# Patient Record
Sex: Male | Born: 1950 | Race: White | Hispanic: No | Marital: Married
Health system: Southern US, Community
[De-identification: ages and names within clinical notes are randomized; demographics above are authoritative.]

---

## 2001-03-12 ENCOUNTER — Other Ambulatory Visit: Admission: RE | Admit: 2001-03-12 | Discharge: 2001-03-12 | Payer: Self-pay | Admitting: Urology

## 2001-03-12 ENCOUNTER — Encounter (INDEPENDENT_AMBULATORY_CARE_PROVIDER_SITE_OTHER): Payer: Self-pay | Admitting: Specialist

## 2004-05-08 ENCOUNTER — Ambulatory Visit (HOSPITAL_COMMUNITY): Admission: RE | Admit: 2004-05-08 | Discharge: 2004-05-08 | Payer: Self-pay | Admitting: Gastroenterology

## 2007-03-27 ENCOUNTER — Encounter: Admission: RE | Admit: 2007-03-27 | Discharge: 2007-06-10 | Payer: Self-pay | Admitting: Neurosurgery

## 2009-04-02 ENCOUNTER — Emergency Department (HOSPITAL_BASED_OUTPATIENT_CLINIC_OR_DEPARTMENT_OTHER): Admission: EM | Admit: 2009-04-02 | Discharge: 2009-04-02 | Payer: Self-pay | Admitting: Emergency Medicine

## 2009-04-02 ENCOUNTER — Ambulatory Visit: Payer: Self-pay | Admitting: Radiology

## 2010-10-04 IMAGING — CR DG WRIST COMPLETE 3+V*R*
4 series · 4 of 4 positions shown · non-contrast
Comparison: None

CLINICAL DATA: Fell off of a ladder;   bilateral wrist pain

RIGHT WRIST - COMPLETE 3+ VIEW

[x wrist pa right]
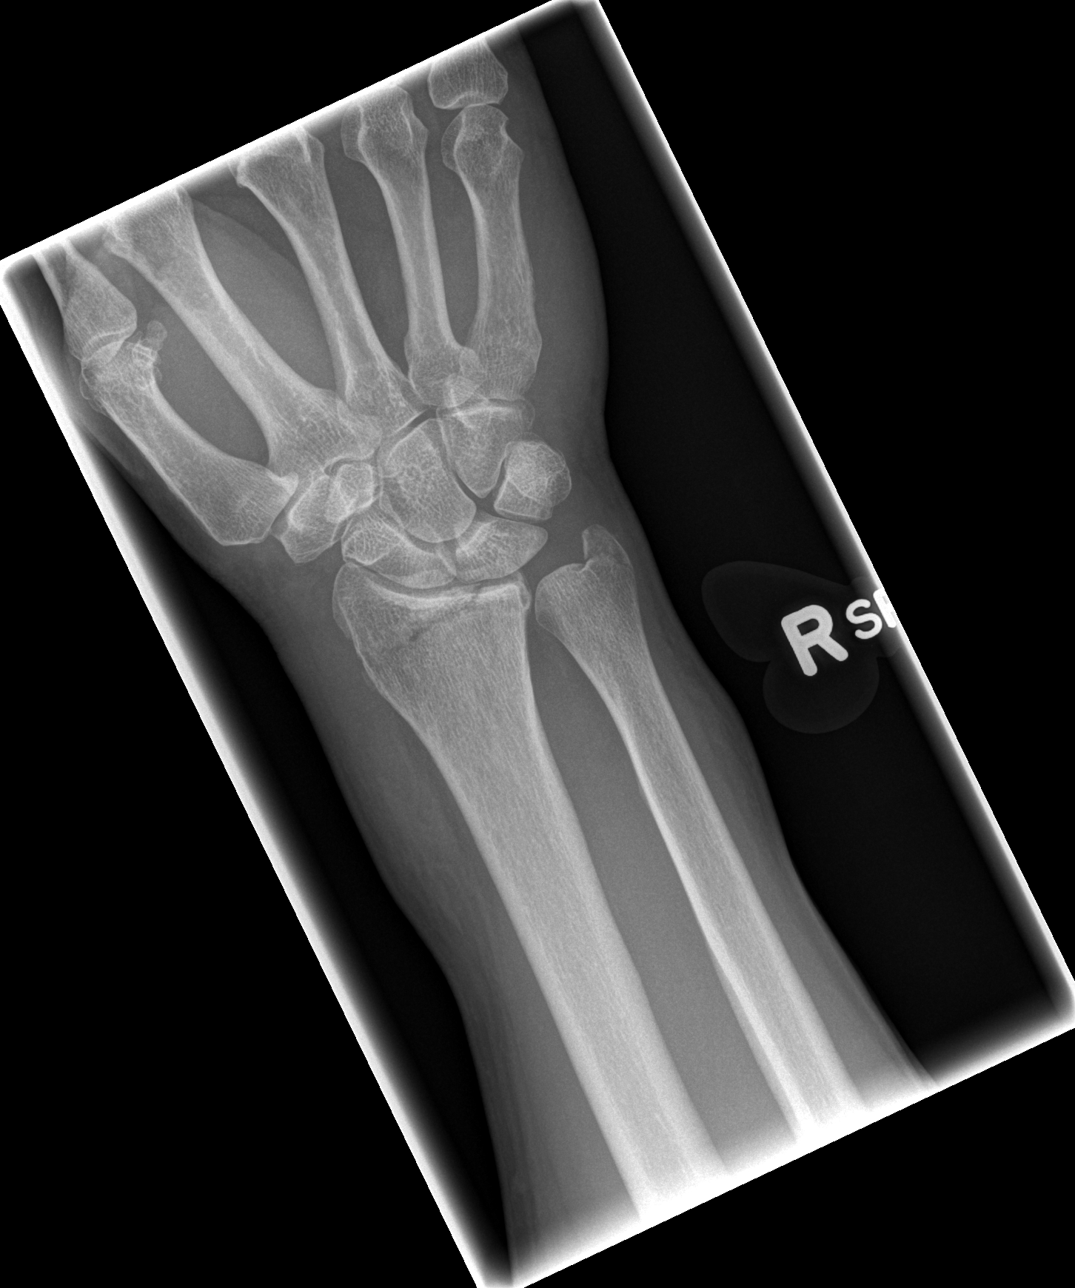

[x wrist obl right]
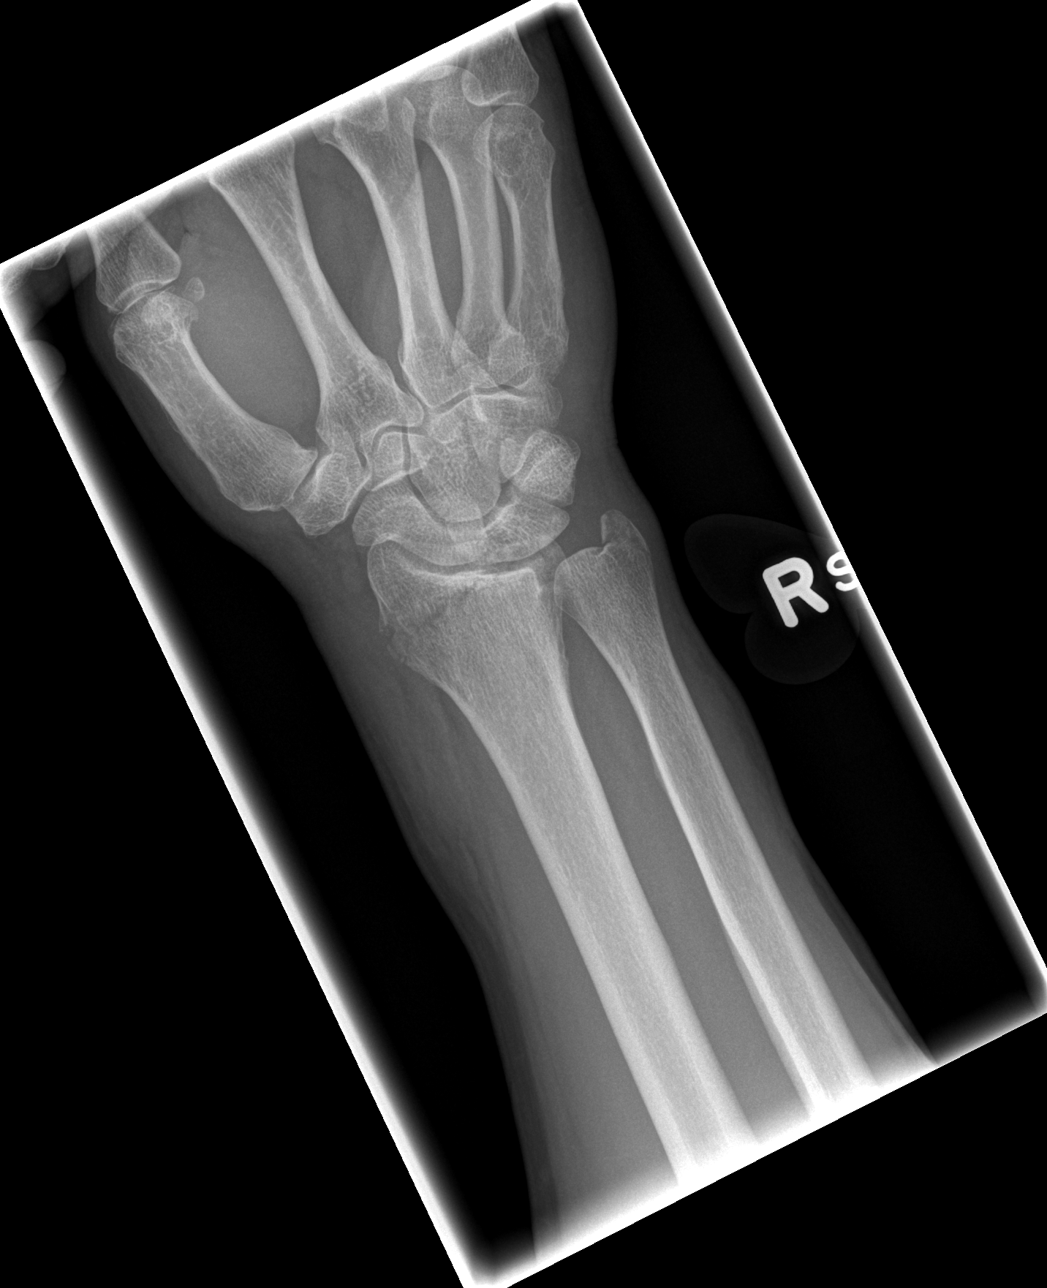

[x wrist lat right]
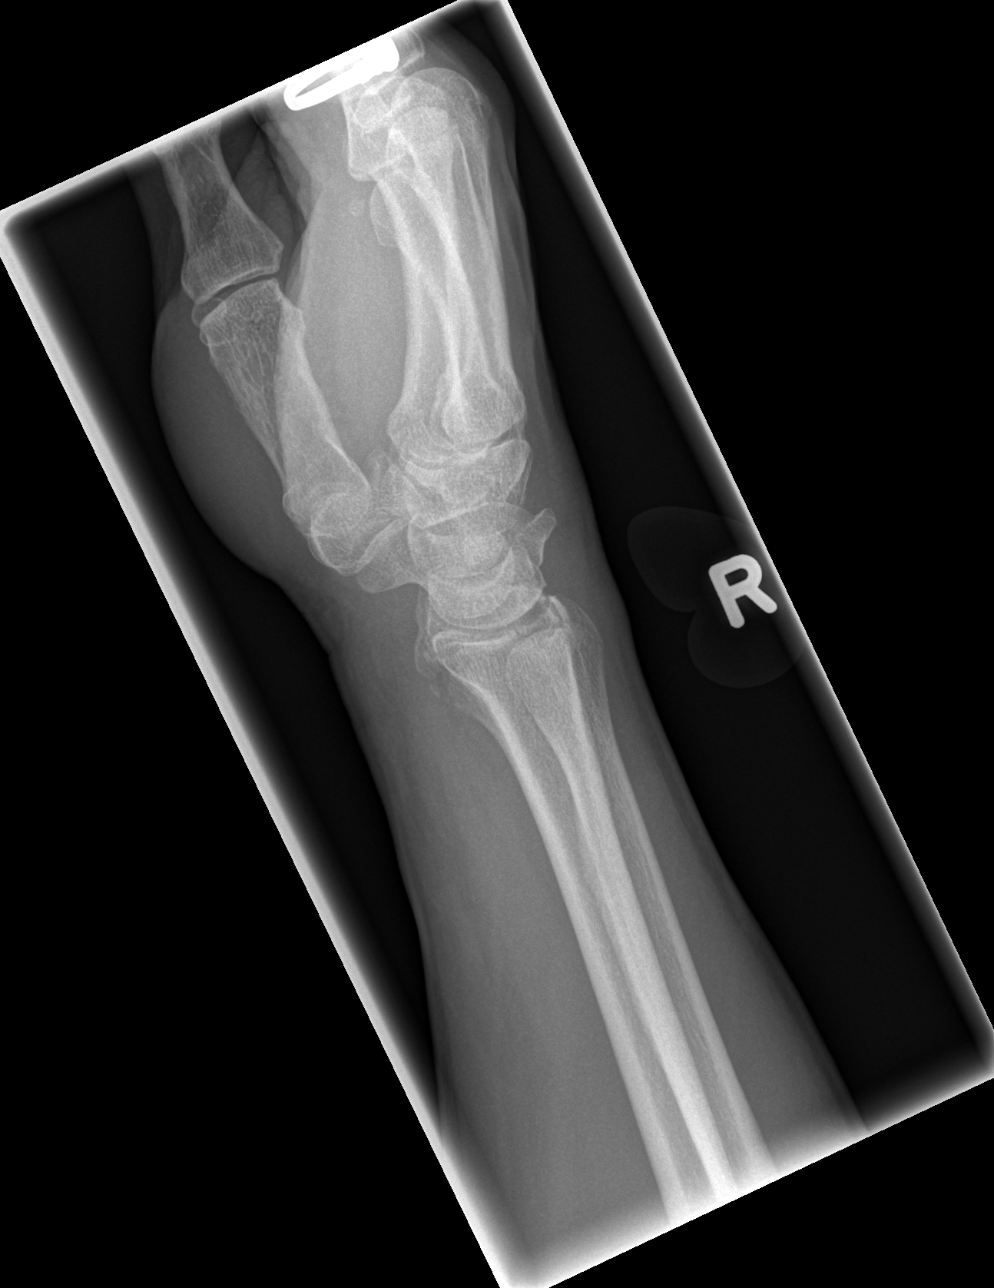

[x navicular]
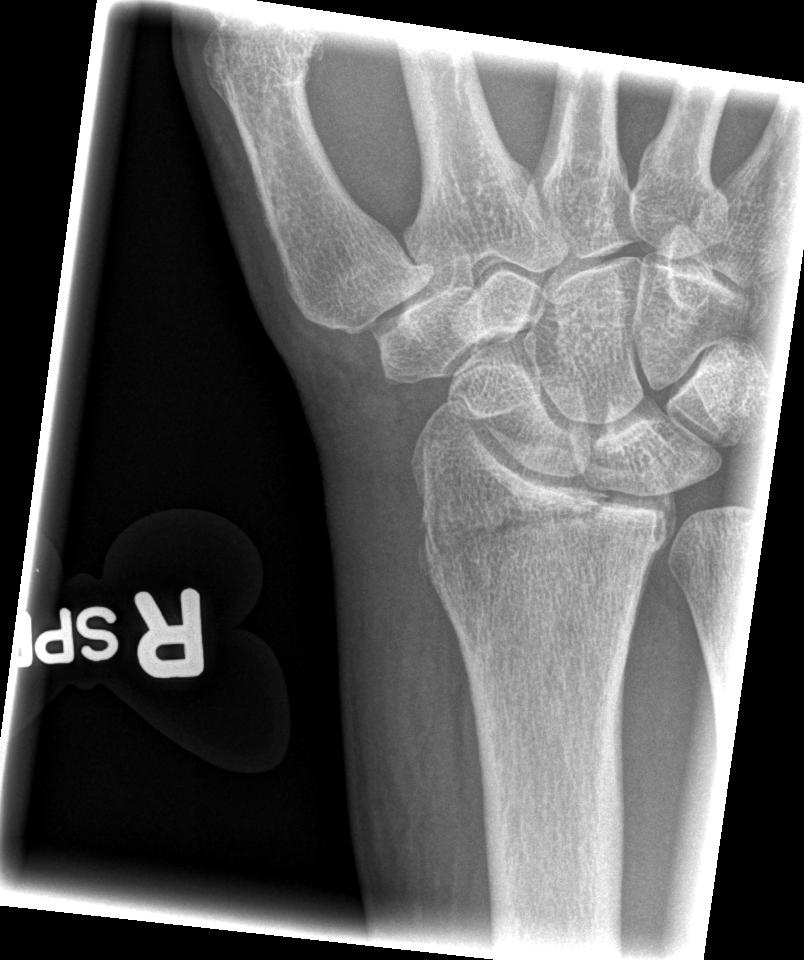

[4 of 4 positions shown; findings below may reference images not displayed]

FINDINGS: There is a nondisplaced fracture of the distal radial
metaphysis with extension to the articular surface.  There is also
a nondisplaced fracture of the base of the ulnar styloid process.
IMPRESSION: Fractures of distal right radial metaphysis and ulnar styloid
process.

## 2010-10-04 IMAGING — CR DG FOREARM 2V*R*
2 series · 2 of 2 positions shown · non-contrast
Comparison: Views of right wrist.

CLINICAL DATA: The patient fell

RIGHT FOREARM - 2 VIEW

[x forearm lat right]
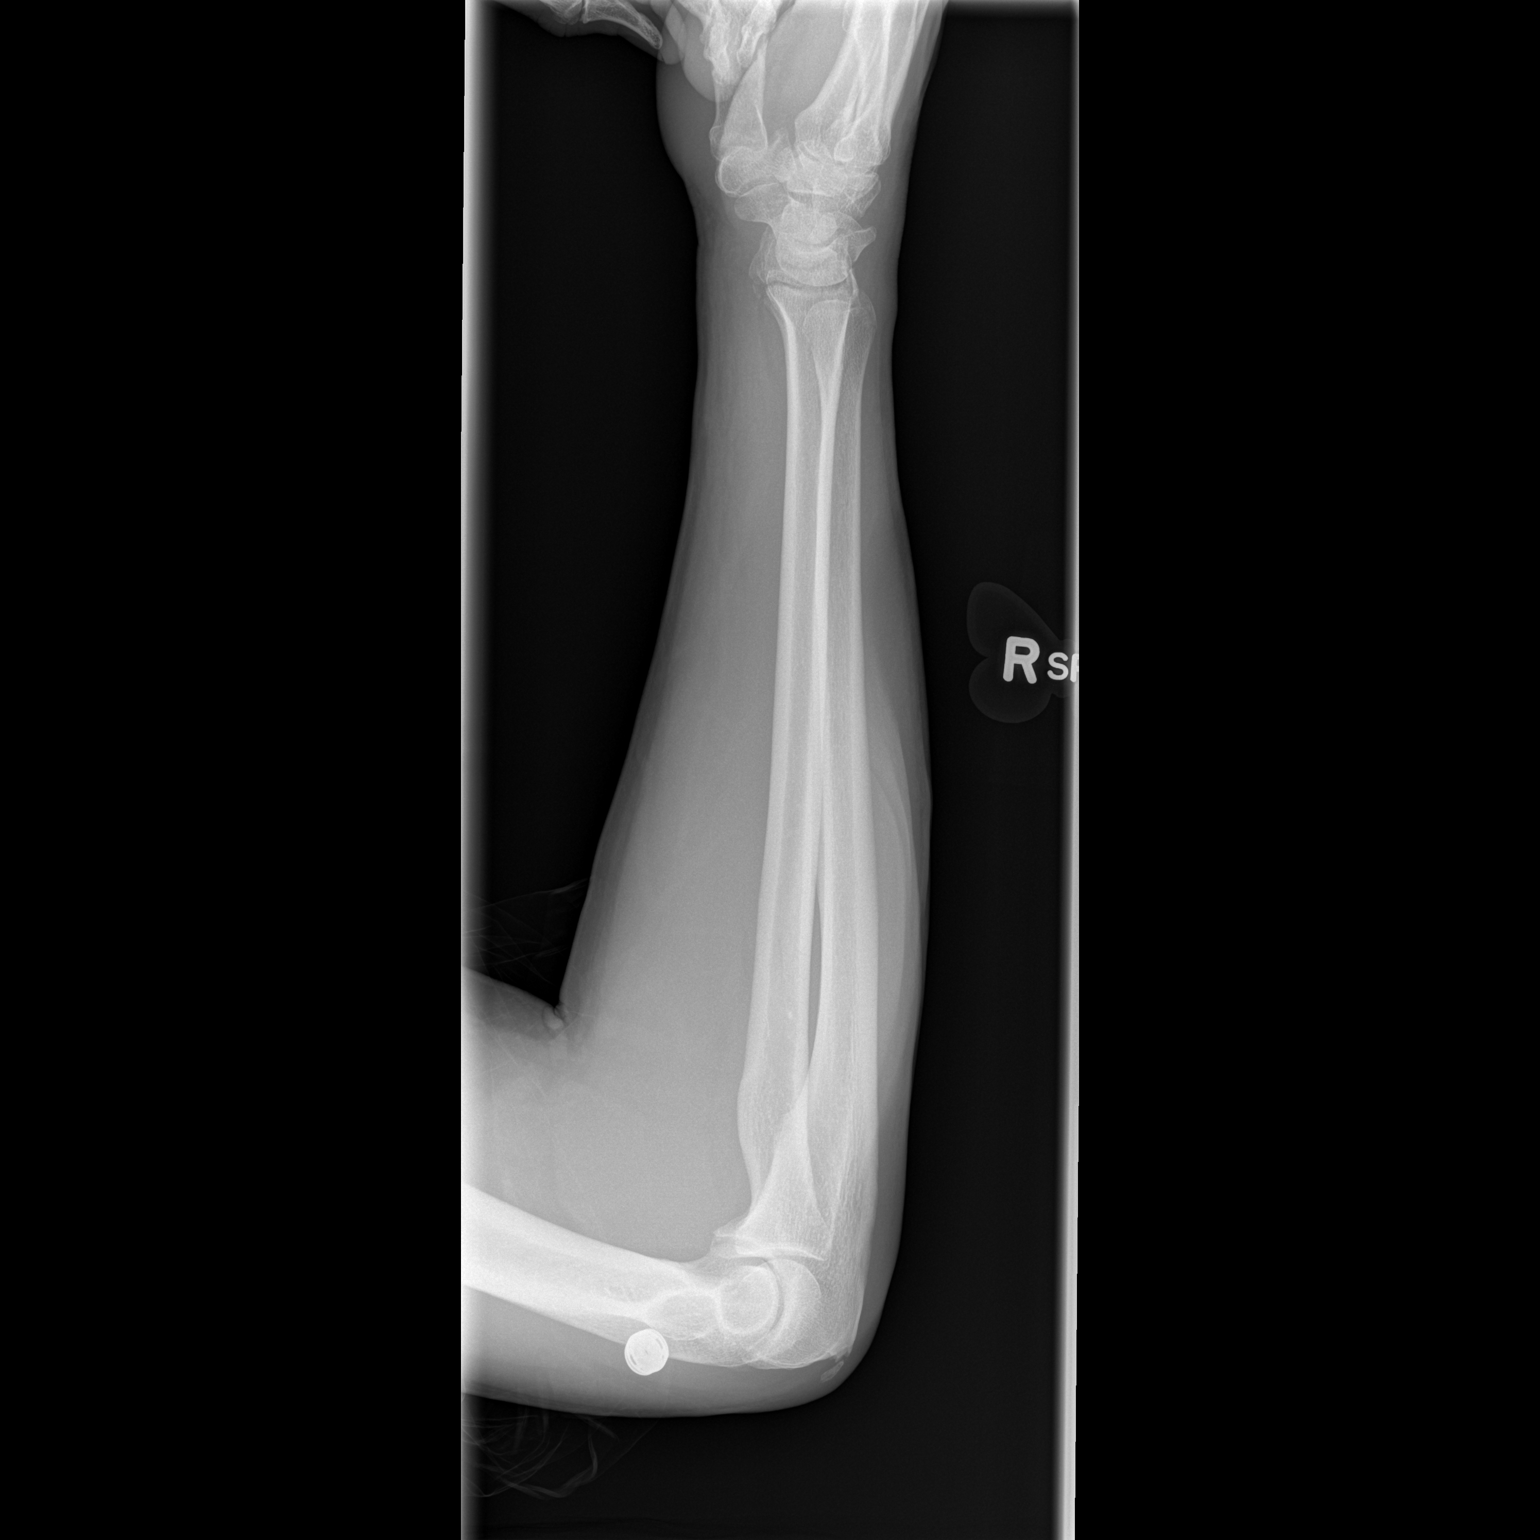

[x forearm ap right]
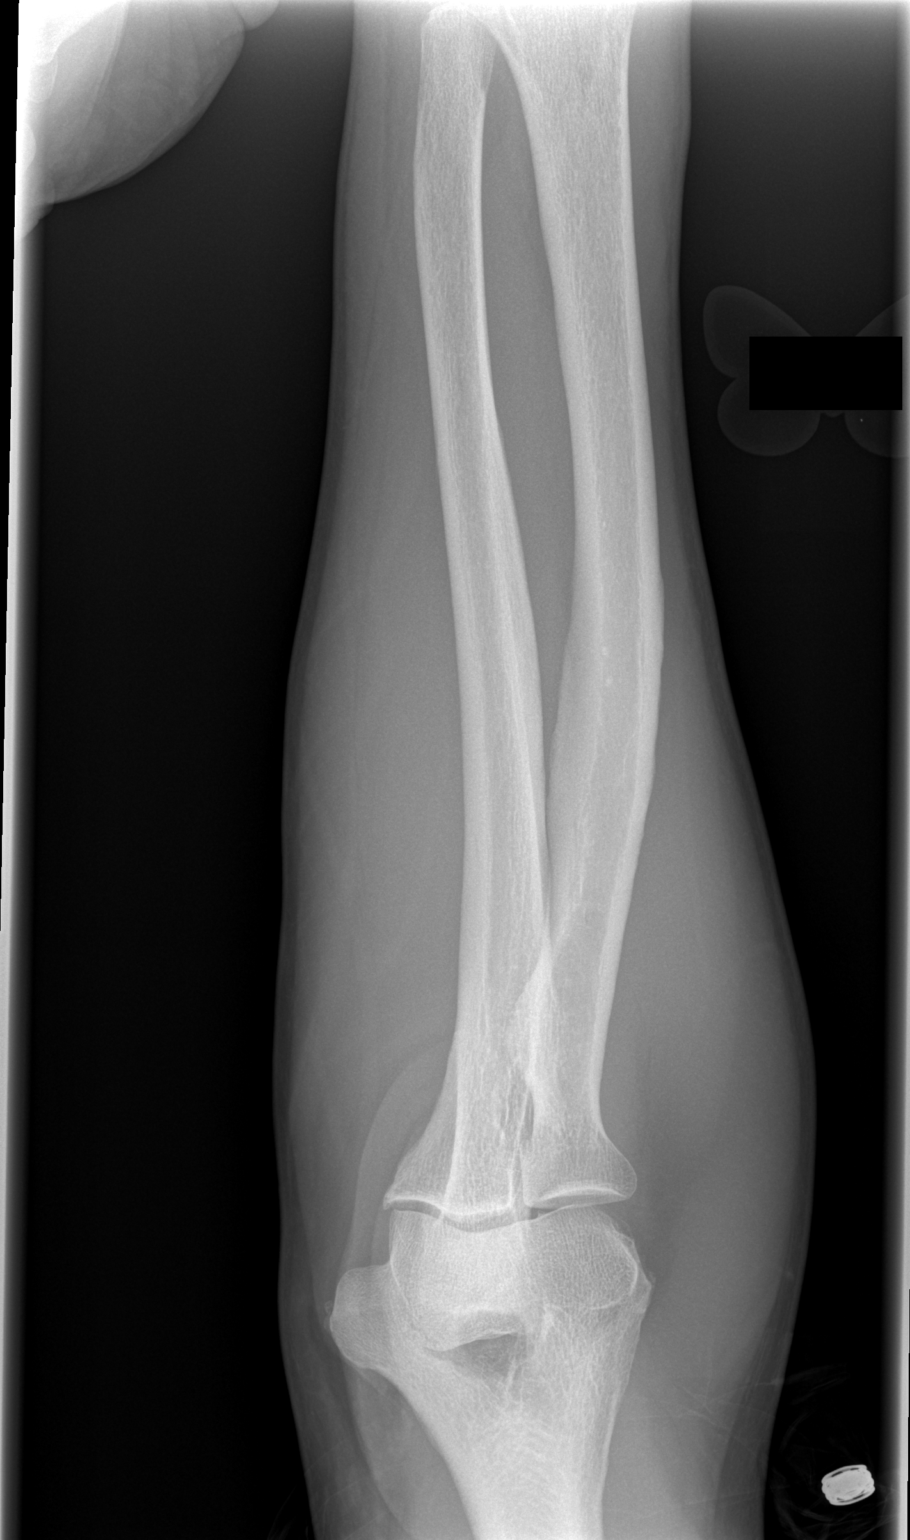

[2 of 2 positions shown; findings below may reference images not displayed]

FINDINGS: Fractures of the distal radial metaphysis and ulnar
styloid process are again noted.  The forearm is intact.
IMPRESSION: Fractures of distal radial metaphysis and ulnar styloid process.

## 2010-10-04 IMAGING — CR DG PELVIS 1-2V
1 series · 1 of 1 positions shown · non-contrast
Comparison: None

CLINICAL DATA: Fell off of a ladder.  Right lateral hip pain.

PELVIS - 1-2 VIEW

[t pelvis a.p.]
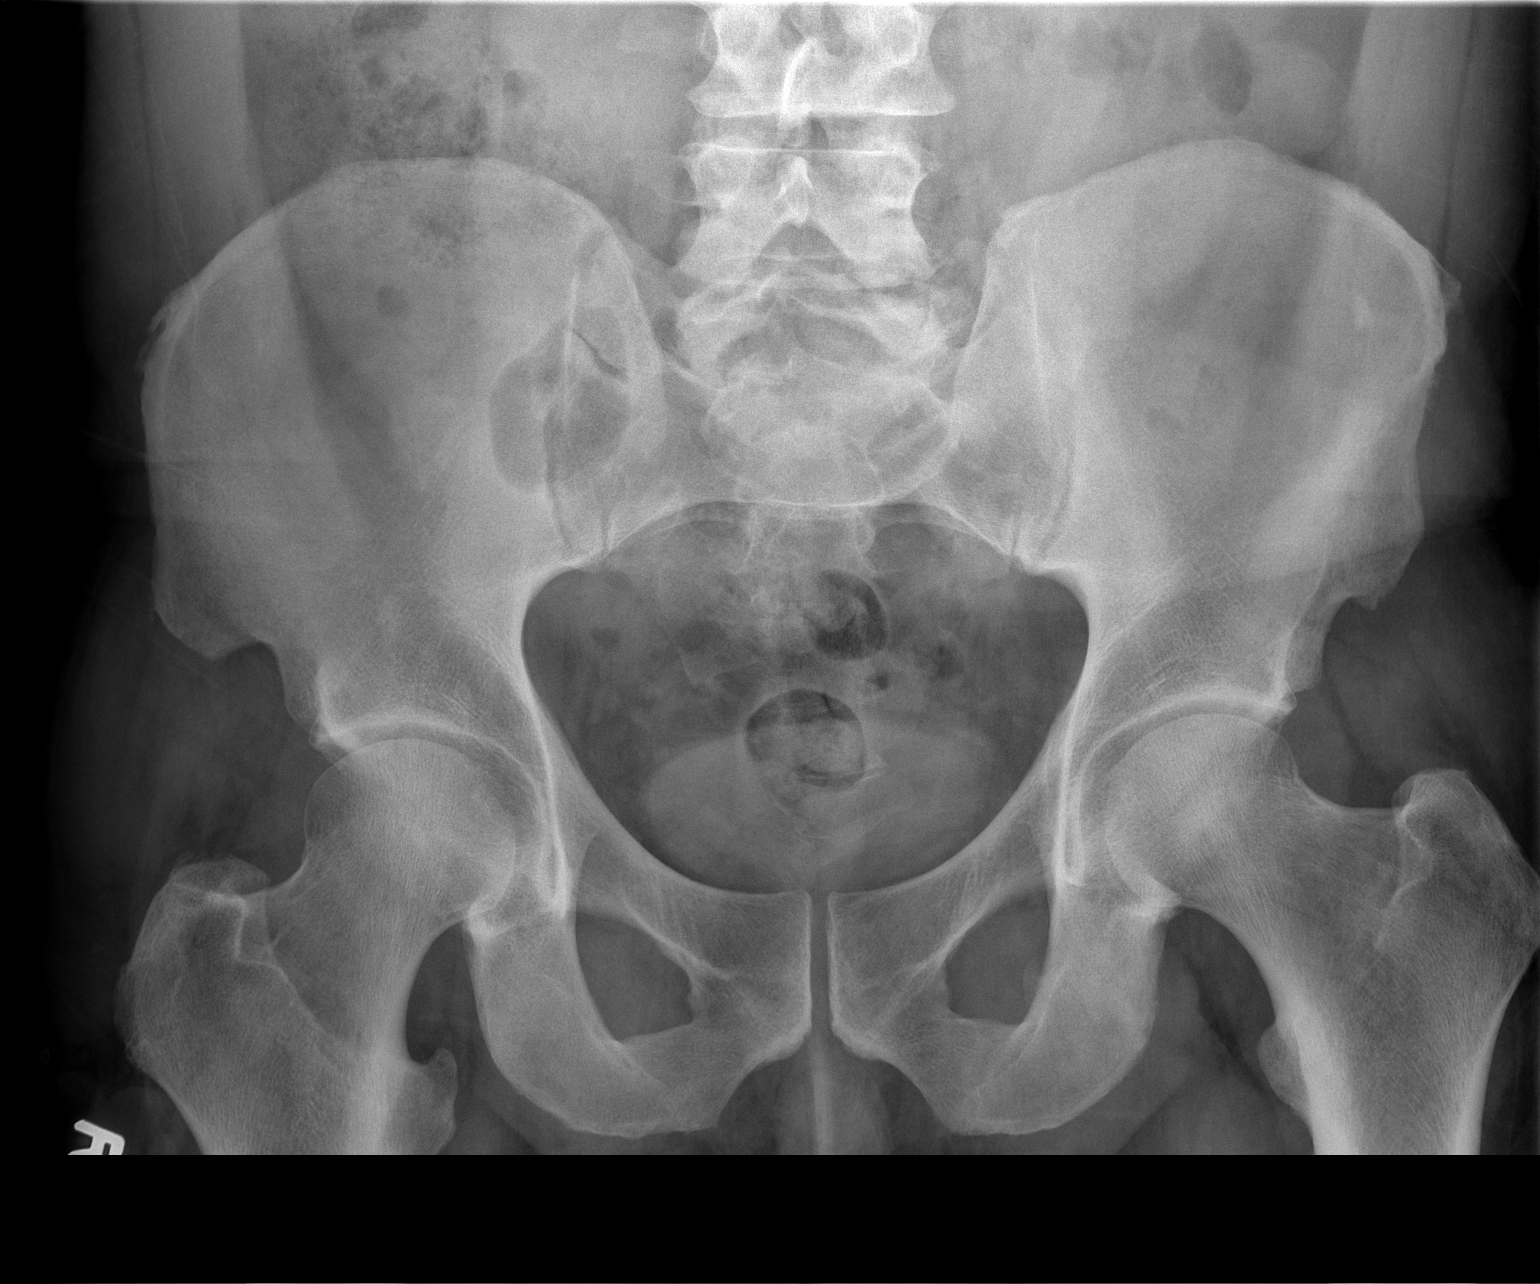

[1 of 1 positions shown; findings below may reference images not displayed]

FINDINGS: There is no evidence of pelvic fracture or diastasis.  No
other pelvic bone lesions are seen.
IMPRESSION: Negative.

## 2010-11-03 NOTE — Op Note (Signed)
Jimmy Spencer, Jimmy Spencer            ACCOUNT NO.:  [id number]   MEDICAL RECORD NO.:  [id number]          PATIENT TYPE:  AMB   LOCATION:  ENDO                         FACILITY:  [facility]   PHYSICIAN:  Danise Edge, M.D.   DATE OF BIRTH:  Oct 19, 1950   DATE OF PROCEDURE:  05/08/2004  DATE OF DISCHARGE:                                 OPERATIVE REPORT   PROCEDURE:  Screening colonoscopy.   INDICATIONS FOR PROCEDURE:  Jimmy Spencer is a 60 year old male born  Dec 25, 1950.  Jimmy Spencer is scheduled to undergo his first  screening colonoscopy with polypectomy to prevent colon cancer.   ENDOSCOPIST:  Danise Edge, M.D.   PREMEDICATION:  Versed 6 mg, Demerol 60 mg.   DESCRIPTION OF PROCEDURE:  After obtaining informed consent, Jimmy Spencer  was placed in the left lateral decubitus position. I administered  intravenous Demerol and intravenous Versed to achieve conscious sedation for  the procedure. The patient's blood pressure, oxygen saturation and cardiac  rhythm were monitored throughout the procedure and documented in the medical  record.   Anal inspection and digital rectal exam were normal.  The prostate was non-  nodular.  The Olympus adjustable pediatric colonoscope was introduced into  the rectum and advanced to the cecum. Colonic preparation for the exam today  was excellent.   RECTUM:  Normal.   SIGMOID COLON AND DESCENDING COLON:  Normal.   SPLENIC FLEXURE:  Normal.   TRANSVERSE COLON:  Normal.   HEPATIC FLEXURE:  Normal.   ASCENDING COLON:  Normal.   CECUM AND ILEOCECAL VALVE:  Normal.   ASSESSMENT:  Normal screening proctocolonoscopy to the cecum. No endoscopic  evidence for the presence of colorectal neoplasia.      MJ/MEDQ  D:  05/08/2004  T:  05/08/2004  Job:  [id number]   cc:   Sharlet Salina, M.D.  [address]  [place]  [place] [address]  Fax: [phone]

## 2019-01-07 ENCOUNTER — Other Ambulatory Visit: Payer: Self-pay

## 2019-01-07 DIAGNOSIS — Z20822 Contact with and (suspected) exposure to covid-19: Secondary | ICD-10-CM

## 2019-01-11 LAB — NOVEL CORONAVIRUS, NAA: SARS-CoV-2, NAA: NOT DETECTED

## 2019-05-18 ENCOUNTER — Telehealth: Payer: Self-pay | Admitting: Physician Assistant

## 2019-05-18 DIAGNOSIS — U071 COVID-19: Secondary | ICD-10-CM

## 2019-05-18 MED ORDER — BENZONATATE 100 MG PO CAPS: 100.0000 mg | ORAL_CAPSULE | Freq: Two times a day (BID) | ORAL | 0 refills | Status: AC | PRN

## 2019-05-18 NOTE — Progress Notes (Signed)
Your test for COVID-19 was positive, meaning that you were infected with the novel coronavirus and could give the germ to others.    You have been enrolled in [facility] for COVID-19. Daily you will receive a questionnaire within the [facility] website. Our COVID-19 response team will be monitoring your responses daily.  Please continue isolation at home, for at least 10 days since the start of your symptoms and until you have had 24 hours with no fever (without taking a fever reducer) and with improving of symptoms.  Please continue good preventive care measures, including:  frequent hand-washing, avoid touching your face, cover coughs/sneezes, stay out of crowds and keep a 6 foot distance from others.  Recheck or go to the nearest hospital ED tent for re-assessment if fever/cough/breathlessness return.     I am sorry you are still feeling unwell. It is normal to feel symptoms of the virus for more than 2 weeks. Specifically a cough. It is reassuring that your pulse ox is remaining greater than 90% and your temperature has not increased over 100. You should still monitor your symptoms and if your pulse ox drops or your temperature rises or you are unable too keep any fluids down then you should be seen by a provider in person for chest xray and evaluation. For now, I will prescribe you a prescription cough medication called benzonatate for your symptoms. I hope you feel better.   5 minutes spent on this chart
